# Patient Record
Sex: Male | Born: 1970 | Race: White | Hispanic: No | Marital: Married | Smoking: Never smoker
Health system: Southern US, Community
[De-identification: ages and names within clinical notes are randomized; demographics above are authoritative.]

## PROBLEM LIST (undated history)

## (undated) DIAGNOSIS — M719 Bursopathy, unspecified: Secondary | ICD-10-CM

## (undated) DIAGNOSIS — E785 Hyperlipidemia, unspecified: Secondary | ICD-10-CM

## (undated) DIAGNOSIS — N200 Calculus of kidney: Secondary | ICD-10-CM

## (undated) DIAGNOSIS — I10 Essential (primary) hypertension: Secondary | ICD-10-CM

## (undated) DIAGNOSIS — T7840XA Allergy, unspecified, initial encounter: Secondary | ICD-10-CM

## (undated) DIAGNOSIS — I2510 Atherosclerotic heart disease of native coronary artery without angina pectoris: Secondary | ICD-10-CM

## (undated) HISTORY — PX: KNEE ARTHROPLASTY: SHX992

## (undated) HISTORY — PX: NERVE SURGERY: SHX1016

## (undated) HISTORY — DX: Atherosclerotic heart disease of native coronary artery without angina pectoris: I25.10

## (undated) HISTORY — DX: Hyperlipidemia, unspecified: E78.5

## (undated) HISTORY — DX: Essential (primary) hypertension: I10

## (undated) HISTORY — DX: Allergy, unspecified, initial encounter: T78.40XA

---

## 2011-04-01 ENCOUNTER — Ambulatory Visit: Payer: Self-pay | Admitting: Specialist

## 2011-05-20 ENCOUNTER — Ambulatory Visit: Payer: Self-pay | Admitting: Specialist

## 2011-05-26 ENCOUNTER — Ambulatory Visit: Payer: Self-pay | Admitting: Specialist

## 2011-09-23 ENCOUNTER — Ambulatory Visit: Payer: Self-pay | Admitting: Family Medicine

## 2013-11-22 DIAGNOSIS — M545 Low back pain, unspecified: Secondary | ICD-10-CM | POA: Insufficient documentation

## 2013-11-22 DIAGNOSIS — M7060 Trochanteric bursitis, unspecified hip: Secondary | ICD-10-CM | POA: Insufficient documentation

## 2013-11-22 DIAGNOSIS — M7070 Other bursitis of hip, unspecified hip: Secondary | ICD-10-CM | POA: Insufficient documentation

## 2014-05-04 NOTE — Op Note (Signed)
PATIENT NAME:  Clarence PellantDICK, Clarence Mcconnell MR#:  [id number] DATE OF BIRTH:  [birth year]  DATE OF PROCEDURE:  05/26/2011  PREOPERATIVE DIAGNOSIS:  1. Complete tear of the left anterior cruciate ligament.  2. Tear of the posterior horn of the lateral meniscus.   POSTOPERATIVE DIAGNOSIS:  1. Complete tear of the left anterior cruciate ligament.  2. Tear of the posterior horn of the lateral meniscus.   OPERATION:  1. Arthroscopically-assisted left anterior cruciate ligament reconstruction using patellar tendon and bone.  2. Arthroscopic partial left lateral meniscectomy.   SURGEON: Valinda HoarHoward E. Kainoah Bartosiewicz, MD   ANESTHESIA: General LMA plus femoral nerve block.   COMPLICATIONS: None.   ESTIMATED BLOOD LOSS: 100 mL.  REPLACEMENT: None.   OPERATIVE FINDINGS: The patient had a complete tear of the left anterior cruciate ligament with a totally incompetent ligament with a few fibers remaining. The posterior cruciate ligament was intact. The medial compartment was normal with intact articular surfaces. The meniscus was stable.  I did not feel that it needed any peripheral stabilization. The lateral meniscus showed tearing in the posterior portion. The articular surfaces were intact. The patellofemoral region showed an intact patella. There was some chondral erosion in the trochlea just above the notch. There were no loose bodies. There was minimal synovitis.   OPERATIVE PROCEDURE: The patient was brought to the Operating Room where he underwent satisfactory general LMA anesthesia after a femoral nerve block had been put in place. The left leg was prepped and draped in a sterile fashion and positioned appropriately. The tourniquet was not used. The arthroscopy was carried out through standard portals, and the above findings were observed. The smooth motorized resector and basket forceps were used to trim the posterior horn of the lateral meniscus. This tear was chronic in nature. The anterior cruciate ligament  and few  remaining fibers were removed. He had a positive Lachman. A generous notchplasty was done as he had a very narrow notch. Once this was completed, the anterior incision was made just to the medial side of midline from the joint line down to the end of the patellar tendon. The dissection was carried out sharply through subcutaneous tissue with electrocautery being used for hemostasis. The peritenon was released medially from the tibial tubercle all the way up over the patella and retracted and protected. The 10 mm double-bladed knife was inserted and a 10 mm graft isolated from the midportion of the patellar tendon. This was carried up over the patella and down over Gerdy's tubercle at the patellar tendon insertion. Bone grafts, 10 x 25 mm, were taken from the patella and the tibial tubercle, and the entire graft was placed on the back table. It was debrided with a rongeur and a bur. Drill holes were made, and it was placed under tension. It measured 95 mm. It was covered with a moist antibiotic sponge. The tibial alignment jig was inserted at 55 degrees of angulation and a guidepin inserted. This was deemed in good position, and the 10 mm reamer was used to ream the tibia. The blunt trocar was inserted to smooth the tunnel edges. A rasp was also used. The 7 mm offset femoral guide was inserted and a Beath pin passed up through the femur and out the anterior thigh. The #2 Orthocord sutures were placed through both bone grafts, and the sutures were passed through the Beath pin and brought up through the femur and out the thigh. The graft was then advanced into the tibia and across the joint  into the femur without difficulty. The length was excellent. We had created a 25 mm tunnel in the femur as well with the coring reamer. The nitinol wire was inserted parallel to the graft in the femur, and a 9 x 23 mm Milagro screw was inserted and seated snugly. Traction on the graft showed it to be extremely tight. The knee was  then placed in 20 degrees of flexion and other nitinol wire passed into the tibial tunnel, and a 9 x 30 mm Milagro screw was inserted and tightened while significant tension was placed on the graft. Once this was in place, the wire was removed. Testing the AP laxity showed it was quite tight now. It was flexed from 0 to 110 degrees easily. The arthroscope showed the graft to be in excellent position. Final irrigation and debridement was carried out in the joint and electrocautery used on small bleeders. The peritenon was repaired with 3-0 Vicryl, and bone chips were placed in the patellar donor site. Bone wax was placed in the tibial donor site and over the tibial tunnel. The subcutaneous tissue was closed with 3-0 Vicryl, and the skin was closed with staples; 0.5% Marcaine with epinephrine and morphine was placed into the joint. A dry sterile dressing with Polar Care and knee immobilizer were applied. The patient was awakened and taken to recovery in good condition.   ____________________________ Valinda Hoar, MD hem:cbb D: 05/26/2011 11:13:45 ET T: 05/26/2011 11:52:31 ET JOB#: [id number] Valinda Hoar MD ELECTRONICALLY SIGNED 06/01/2011 12:45

## 2014-05-18 ENCOUNTER — Emergency Department: Payer: 59

## 2014-05-18 ENCOUNTER — Encounter: Payer: Self-pay | Admitting: Emergency Medicine

## 2014-05-18 ENCOUNTER — Emergency Department
Admission: EM | Admit: 2014-05-18 | Discharge: 2014-05-18 | Disposition: A | Discharge status: Home / Self Care | Payer: 59 | Attending: Emergency Medicine | Admitting: Emergency Medicine

## 2014-05-18 DIAGNOSIS — T148XXA Other injury of unspecified body region, initial encounter: Secondary | ICD-10-CM

## 2014-05-18 DIAGNOSIS — Z23 Encounter for immunization: Secondary | ICD-10-CM | POA: Diagnosis not present

## 2014-05-18 DIAGNOSIS — S52501A Unspecified fracture of the lower end of right radius, initial encounter for closed fracture: Secondary | ICD-10-CM | POA: Insufficient documentation

## 2014-05-18 DIAGNOSIS — Y9289 Other specified places as the place of occurrence of the external cause: Secondary | ICD-10-CM | POA: Insufficient documentation

## 2014-05-18 DIAGNOSIS — Y998 Other external cause status: Secondary | ICD-10-CM | POA: Diagnosis not present

## 2014-05-18 DIAGNOSIS — W1839XA Other fall on same level, initial encounter: Secondary | ICD-10-CM | POA: Insufficient documentation

## 2014-05-18 DIAGNOSIS — S50811A Abrasion of right forearm, initial encounter: Secondary | ICD-10-CM | POA: Insufficient documentation

## 2014-05-18 DIAGNOSIS — Y9389 Activity, other specified: Secondary | ICD-10-CM | POA: Insufficient documentation

## 2014-05-18 DIAGNOSIS — S59911A Unspecified injury of right forearm, initial encounter: Secondary | ICD-10-CM | POA: Diagnosis present

## 2014-05-18 DIAGNOSIS — W19XXXA Unspecified fall, initial encounter: Secondary | ICD-10-CM

## 2014-05-18 HISTORY — DX: Bursopathy, unspecified: M71.9

## 2014-05-18 MED ORDER — OXYCODONE-ACETAMINOPHEN 5-325 MG PO TABS: 1.0000 | ORAL_TABLET | ORAL | Status: DC | PRN

## 2014-05-18 MED ORDER — MORPHINE SULFATE 4 MG/ML IJ SOLN
4.0000 mg | Freq: Once | INTRAMUSCULAR | Status: AC
  Administered 2014-05-18: 4 mg via INTRAMUSCULAR

## 2014-05-18 MED ORDER — MORPHINE SULFATE 4 MG/ML IJ SOLN
INTRAMUSCULAR | Status: AC
  Administered 2014-05-18: 4 mg via INTRAMUSCULAR
  Filled 2014-05-18: qty 1

## 2014-05-18 MED ORDER — TETANUS-DIPHTH-ACELL PERTUSSIS 5-2.5-18.5 LF-MCG/0.5 IM SUSP
INTRAMUSCULAR | Status: AC
  Administered 2014-05-18: 0.5 mL via INTRAMUSCULAR
  Filled 2014-05-18: qty 0.5

## 2014-05-18 MED ORDER — TETANUS-DIPHTH-ACELL PERTUSSIS 5-2.5-18.5 LF-MCG/0.5 IM SUSP
0.5000 mL | Freq: Once | INTRAMUSCULAR | Status: AC
  Administered 2014-05-18: 0.5 mL via INTRAMUSCULAR

## 2014-05-18 NOTE — Discharge Instructions (Signed)
Please seek medical attention for any concerning swollen or discoloration of her fingers, decreased sensation of her fingers, severe pain, high fevers or any other new or concerning symptoms.   Radial Fracture You have a broken bone (fracture) of the forearm. This is the part of your arm between the elbow and your wrist. Your forearm is made up of two bones. These are the radius and ulna. Your fracture is in the radial shaft. This is the bone in your forearm located on the thumb side. A cast or splint is used to protect and keep your injured bone from moving. The cast or splint will be on generally for about 5 to 6 weeks, with individual variations. HOME CARE INSTRUCTIONS   Keep the injured part elevated while sitting or lying down. Keep the injury above the level of your heart (the center of the chest). This will decrease swelling and pain.  Apply ice to the injury for 15-20 minutes, 03-04 times per day while awake, for 2 days. Put the ice in a plastic bag and place a towel between the bag of ice and your cast or splint.  Move your fingers to avoid stiffness and minimize swelling.  If you have a plaster or fiberglass cast:  Do not try to scratch the skin under the cast using sharp or pointed objects.  Check the skin around the cast every day. You may put lotion on any red or sore areas.  Keep your cast dry and clean.  If you have a plaster splint:  Wear the splint as directed.  You may loosen the elastic around the splint if your fingers become numb, tingle, or turn cold or blue.  Do not put pressure on any part of your cast or splint. It may break. Rest your cast only on a pillow for the first 24 hours until it is fully hardened.  Your cast or splint can be protected during bathing with a plastic bag. Do not lower the cast or splint into water.  Only take over-the-counter or prescription medicines for pain, discomfort, or fever as directed by your caregiver. SEEK IMMEDIATE MEDICAL  CARE IF:   Your cast gets damaged or breaks.  You have more severe pain or swelling than you did before getting the cast.  You have severe pain when stretching your fingers.  There is a bad smell, new stains and/or pus-like (purulent) drainage coming from under the cast.  Your fingers or hand turn pale or blue and become cold or your loose feeling. Document Released: 06/09/2005 Document Revised: 03/21/2011 Document Reviewed: 09/05/2005 [facility] Patient Information 2015 [employer] [place]. This information is not intended to replace advice given to you by your health care provider. Make sure you discuss any questions you have with your health care provider.

## 2014-05-18 NOTE — ED Notes (Signed)
Patient states he fell off of boat approx 7 feet approx 1 hour ago. States he started to fall head first, but held arms and hands out to catch self to avoid hitting head. Complaining of pain to entire right side. Ambulatory to triage.

## 2014-05-18 NOTE — ED Provider Notes (Signed)
[facility] Emergency Department Provider Note    ____________________________________________  Time seen: 1740  I have reviewed the triage vital signs and the nursing notes.   HISTORY  Chief Complaint Fall and Shoulder Injury   History limited by: Not Limited   HPI Clarence Mcconnell is a 44 y.o. male who presents to the emergency department after a roughly 7 foot fall onto his right side. Patient was working on his boat when he fell off the bowel. He states the initial impact was on his right arm.He denies hitting his head. He denies loss of consciousness. States he has a little more acute on chronic right hip pain. He was able to ambulate after the fall. That happened roughly 1 hour before presenting to the emergency department. Denies any change to sensation of his arms or legs.    Past Medical History  Diagnosis Date  . Bursitis     Right    There are no active problems to display for this patient.   Past Surgical History  Procedure Laterality Date  . Knee arthroplasty      No current outpatient prescriptions on file.  Allergies Review of patient's allergies indicates no known allergies.  No family history on file.  Social History History  Substance Use Topics  . Smoking status: Never Smoker   . Smokeless tobacco: Not on file  . Alcohol Use: No    Review of Systems Constitutional: Negative for fever. Cardiovascular: Negative for chest pain. Respiratory: Negative for shortness of breath. Gastrointestinal: Negative for abdominal pain, vomiting and diarrhea. Genitourinary: Negative for dysuria. Musculoskeletal: Negative for back pain. Right Wrist pain right hip pain Skin: Negative for rash. Neurological: Negative for headaches, focal weakness or numbness.   10-point ROS otherwise negative.  ____________________________________________   PHYSICAL EXAM:  VITAL SIGNS: ED Triage Vitals  Enc Vitals Group     BP 05/18/14 1715  152/90 mmHg     Pulse Rate 05/18/14 1715 76     Resp --      Temp 05/18/14 1715 97.6 F (36.4 C)     Temp Source 05/18/14 1715 Oral     SpO2 05/18/14 1715 100 %     Weight 05/18/14 1715 175 lb (79.379 kg)     Height 05/18/14 1715 5\' 10"  (1.778 m)   Constitutional: Alert and oriented. Appears to be in mild pain, holding his right hand towards his chest. Eyes: Conjunctivae are normal. PERRL. Normal extraocular movements. ENT   Head: Normocephalic and atraumatic.      Ears: No hematympanum.    Nose: No congestion/rhinnorhea. No blood in nares.    Mouth/Throat: Mucous membranes are moist. No dental injury.   Neck: No stridor. Trachea midline. No midline cervical tenderness. Painless ROM.  Hematological/Lymphatic/Immunilogical: No cervical lymphadenopathy. Cardiovascular: Normal rate, regular rhythm.  No murmurs, rubs, or gallops. Pelvis stable.  Respiratory: Normal respiratory effort without tachypnea nor retractions. Breath sounds are clear and equal bilaterally. No wheezes/rales/rhonchi. No crepitus. No chest wall tenderness.  Gastrointestinal: Soft and nontender. No distention.  Genitourinary: Deferred Musculoskeletal: Normal range of motion in all extremities. Right wrist with mild deformity, tenderness. Neurovascularly intact distally. Neurologic:  Normal speech and language. No gross focal neurologic deficits are appreciated. Speech is normal.  Skin:  Skin is warm, dry. Abrasion to right proximal forearm Psychiatric: Mood and affect are normal. Speech and behavior are normal. Patient exhibits appropriate insight and judgment.  ____________________________________________    LABS (pertinent positives/negatives)  None  ____________________________________________   EKG  None  ____________________________________________    RADIOLOGY  IMPRESSION: Acute transverse fracture of the distal right radius with extension of the fracture line to the joint space.  Fracture fragments are slightly displaced, with dorsal displacement on the lateral view. Associated soft tissue swelling. ____________________________________________   PROCEDURES  Procedure(s) performed:  Splint applied by tech.  Good position.  Distally N/V intact, sensation intact. No discoloration.   Critical Care performed: No  ____________________________________________   INITIAL IMPRESSION / ASSESSMENT AND PLAN / ED COURSE  Pertinent labs & imaging results that were available during my care of the patient were reviewed by me and considered in my medical decision making (see chart for details).  Patient here after fall. Injuries include abrasion to right proximal forearm, will update tetanus. Additionally patient has swelling, deformity and tenderness to right wrist. We will get an x-ray.  ----------------------------------------- 7:10 PM on 05/18/2014 -----------------------------------------  X-ray does show an acute transverse fracture of the distal right radius. Talked to Dr. Joice LoftsPoggi with orthopedics who looked at the films. Given that the patient will require a surgical fix he did not think patient would have significant benefit from acutely trying to reduce the fracture. We will splint and have patient follow-up with orthopedics.  ____________________________________________   FINAL CLINICAL IMPRESSION(S) / ED DIAGNOSES  Final diagnoses:  Fall, initial encounter  Distal radius fracture, right, closed, initial encounter  Abrasion      Phineas SemenGraydon Myrella Fahs, MD 05/18/14 2004

## 2014-05-20 ENCOUNTER — Ambulatory Visit: Payer: 59 | Admitting: Anesthesiology

## 2014-05-20 ENCOUNTER — Encounter
Admission: RE | Disposition: A | Discharge status: Home / Self Care | Payer: Self-pay | Source: Ambulatory Visit | Attending: Orthopedic Surgery

## 2014-05-20 ENCOUNTER — Ambulatory Visit
Admission: RE | Admit: 2014-05-20 | Discharge: 2014-05-20 | Disposition: A | Discharge status: Home / Self Care | Payer: 59 | Source: Ambulatory Visit | Attending: Orthopedic Surgery | Admitting: Orthopedic Surgery

## 2014-05-20 ENCOUNTER — Encounter: Payer: Self-pay | Admitting: *Deleted

## 2014-05-20 DIAGNOSIS — N189 Chronic kidney disease, unspecified: Secondary | ICD-10-CM | POA: Insufficient documentation

## 2014-05-20 DIAGNOSIS — Z91010 Allergy to peanuts: Secondary | ICD-10-CM | POA: Diagnosis not present

## 2014-05-20 DIAGNOSIS — Z87892 Personal history of anaphylaxis: Secondary | ICD-10-CM | POA: Diagnosis not present

## 2014-05-20 DIAGNOSIS — Z8249 Family history of ischemic heart disease and other diseases of the circulatory system: Secondary | ICD-10-CM | POA: Diagnosis not present

## 2014-05-20 DIAGNOSIS — E785 Hyperlipidemia, unspecified: Secondary | ICD-10-CM | POA: Diagnosis not present

## 2014-05-20 DIAGNOSIS — I129 Hypertensive chronic kidney disease with stage 1 through stage 4 chronic kidney disease, or unspecified chronic kidney disease: Secondary | ICD-10-CM | POA: Diagnosis not present

## 2014-05-20 DIAGNOSIS — Z8261 Family history of arthritis: Secondary | ICD-10-CM | POA: Diagnosis not present

## 2014-05-20 DIAGNOSIS — Z841 Family history of disorders of kidney and ureter: Secondary | ICD-10-CM | POA: Insufficient documentation

## 2014-05-20 DIAGNOSIS — S52501A Unspecified fracture of the lower end of right radius, initial encounter for closed fracture: Secondary | ICD-10-CM | POA: Insufficient documentation

## 2014-05-20 DIAGNOSIS — Z9889 Other specified postprocedural states: Secondary | ICD-10-CM | POA: Insufficient documentation

## 2014-05-20 DIAGNOSIS — Z888 Allergy status to other drugs, medicaments and biological substances status: Secondary | ICD-10-CM | POA: Diagnosis not present

## 2014-05-20 DIAGNOSIS — W1789XA Other fall from one level to another, initial encounter: Secondary | ICD-10-CM | POA: Insufficient documentation

## 2014-05-20 DIAGNOSIS — G5601 Carpal tunnel syndrome, right upper limb: Secondary | ICD-10-CM | POA: Insufficient documentation

## 2014-05-20 DIAGNOSIS — Z87442 Personal history of urinary calculi: Secondary | ICD-10-CM | POA: Diagnosis not present

## 2014-05-20 DIAGNOSIS — Z79891 Long term (current) use of opiate analgesic: Secondary | ICD-10-CM | POA: Insufficient documentation

## 2014-05-20 HISTORY — PX: CARPAL TUNNEL RELEASE: SHX101

## 2014-05-20 HISTORY — DX: Calculus of kidney: N20.0

## 2014-05-20 HISTORY — PX: OPEN REDUCTION INTERNAL FIXATION (ORIF) SCAPHOID WITH DISTAL RADIUS GRAFT: SHX5667

## 2014-05-20 SURGERY — CARPAL TUNNEL RELEASE
Anesthesia: General | Laterality: Right

## 2014-05-20 MED ORDER — LABETALOL HCL 5 MG/ML IV SOLN
10.0000 mg | Freq: Once | INTRAVENOUS | Status: AC
  Administered 2014-05-20: 10 mg via INTRAVENOUS

## 2014-05-20 MED ORDER — LIDOCAINE HCL (PF) 1 % IJ SOLN
INTRAMUSCULAR | Status: AC
  Filled 2014-05-20: qty 30

## 2014-05-20 MED ORDER — ONDANSETRON HCL 4 MG/2ML IJ SOLN: 4.0000 mg | Freq: Once | INTRAMUSCULAR | Status: DC | PRN

## 2014-05-20 MED ORDER — MIDAZOLAM HCL 2 MG/2ML IJ SOLN
INTRAMUSCULAR | Status: DC | PRN
  Administered 2014-05-20: 2 mg via INTRAVENOUS

## 2014-05-20 MED ORDER — DIPHENHYDRAMINE HCL 50 MG/ML IJ SOLN
25.0000 mg | Freq: Once | INTRAMUSCULAR | Status: AC
  Administered 2014-05-20: 25 mg via INTRAVENOUS

## 2014-05-20 MED ORDER — HYDROMORPHONE HCL 1 MG/ML IJ SOLN
0.2500 mg | INTRAMUSCULAR | Status: DC | PRN
  Administered 2014-05-20 (×3): 0.5 mg via INTRAVENOUS

## 2014-05-20 MED ORDER — NEOMYCIN-POLYMYXIN B GU 40-200000 IR SOLN
Status: AC
  Filled 2014-05-20: qty 2

## 2014-05-20 MED ORDER — LIDOCAINE HCL (CARDIAC) 20 MG/ML IV SOLN
INTRAVENOUS | Status: DC | PRN
  Administered 2014-05-20: 100 mg via INTRAVENOUS

## 2014-05-20 MED ORDER — CEFAZOLIN SODIUM-DEXTROSE 2-3 GM-% IV SOLR
INTRAVENOUS | Status: AC
  Filled 2014-05-20: qty 50

## 2014-05-20 MED ORDER — ONDANSETRON HCL 4 MG/2ML IJ SOLN
INTRAMUSCULAR | Status: DC | PRN
  Administered 2014-05-20: 4 mg via INTRAVENOUS

## 2014-05-20 MED ORDER — DIPHENHYDRAMINE HCL 50 MG/ML IJ SOLN
INTRAMUSCULAR | Status: AC
  Filled 2014-05-20: qty 1

## 2014-05-20 MED ORDER — NEOMYCIN-POLYMYXIN B GU IR SOLN
Status: DC | PRN
Start: 2014-05-20 — End: 2014-05-20
  Administered 2014-05-20: 2 mL

## 2014-05-20 MED ORDER — HYDROMORPHONE HCL 1 MG/ML IJ SOLN
INTRAMUSCULAR | Status: AC
  Administered 2014-05-20: 0.5 mg via INTRAVENOUS
  Filled 2014-05-20: qty 1

## 2014-05-20 MED ORDER — HYDRALAZINE HCL 20 MG/ML IJ SOLN
INTRAMUSCULAR | Status: AC
  Administered 2014-05-20: 10 mg via INTRAVENOUS
  Filled 2014-05-20: qty 1

## 2014-05-20 MED ORDER — PROPOFOL 10 MG/ML IV BOLUS
INTRAVENOUS | Status: DC | PRN
  Administered 2014-05-20: 200 mg via INTRAVENOUS

## 2014-05-20 MED ORDER — BUPIVACAINE HCL 0.5 % IJ SOLN
INTRAMUSCULAR | Status: DC | PRN
  Administered 2014-05-20: 20 mL

## 2014-05-20 MED ORDER — LABETALOL HCL 5 MG/ML IV SOLN
INTRAVENOUS | Status: AC
  Filled 2014-05-20: qty 4

## 2014-05-20 MED ORDER — LACTATED RINGERS IV SOLN
INTRAVENOUS | Status: DC
  Administered 2014-05-20: 14:00:00 via INTRAVENOUS

## 2014-05-20 MED ORDER — FENTANYL CITRATE (PF) 100 MCG/2ML IJ SOLN
INTRAMUSCULAR | Status: DC | PRN
  Administered 2014-05-20: 50 ug via INTRAVENOUS
  Administered 2014-05-20: 100 ug via INTRAVENOUS
  Administered 2014-05-20: 50 ug via INTRAVENOUS

## 2014-05-20 MED ORDER — HYDRALAZINE HCL 20 MG/ML IJ SOLN
10.0000 mg | Freq: Once | INTRAMUSCULAR | Status: AC
  Administered 2014-05-20: 10 mg via INTRAVENOUS

## 2014-05-20 MED ORDER — CEFAZOLIN SODIUM-DEXTROSE 2-3 GM-% IV SOLR
2.0000 g | Freq: Once | INTRAVENOUS | Status: AC
  Administered 2014-05-20: 2 g via INTRAVENOUS

## 2014-05-20 MED ORDER — BUPIVACAINE HCL (PF) 0.5 % IJ SOLN
INTRAMUSCULAR | Status: AC
  Filled 2014-05-20: qty 30

## 2014-05-20 MED ORDER — DEXAMETHASONE SODIUM PHOSPHATE 4 MG/ML IJ SOLN
INTRAMUSCULAR | Status: DC | PRN
  Administered 2014-05-20: 5 mg via INTRAVENOUS

## 2014-05-20 SURGICAL SUPPLY — 50 items
2.0MM SILVER DRILL BIT ×2 IMPLANT
2.5MM GOLD DRILL BITS ×2 IMPLANT
BANDAGE ELASTIC 3 CLIP ST LF (GAUZE/BANDAGES/DRESSINGS) ×2 IMPLANT
BANDAGE ELASTIC 4 CLIP ST LF (GAUZE/BANDAGES/DRESSINGS) ×2 IMPLANT
BIT DRILL 2 FAST STEP (BIT) ×2 IMPLANT
BIT DRILL 2.5X4 QC (BIT) ×2 IMPLANT
BNDG ESMARK 4X12 TAN STRL LF (GAUZE/BANDAGES/DRESSINGS) ×2 IMPLANT
CANISTER SUCT 1200ML W/VALVE (MISCELLANEOUS) ×2 IMPLANT
CHLORAPREP W/TINT 26ML (MISCELLANEOUS) ×2 IMPLANT
DRAPE FLUOR MINI C-ARM 54X84 (DRAPES) ×2 IMPLANT
DRSG TELFA 3X8 NADH (GAUZE/BANDAGES/DRESSINGS) ×2 IMPLANT
ELECT CAUTERY NEEDLE 2.0 MIC (NEEDLE) ×2 IMPLANT
ELECT CAUTERY NEEDLE TIP 1.0 (MISCELLANEOUS) ×2
ELECTRODE CAUTERY NEDL TIP 1.0 (MISCELLANEOUS) ×1 IMPLANT
GAUZE PETRO XEROFOAM 1X8 (MISCELLANEOUS) ×2 IMPLANT
GAUZE SPONGE 4X4 12PLY STRL (GAUZE/BANDAGES/DRESSINGS) ×2 IMPLANT
GLOVE BIOGEL PI IND STRL 9 (GLOVE) ×1 IMPLANT
GLOVE BIOGEL PI INDICATOR 9 (GLOVE) ×1
GLOVE SURG ORTHO 9.0 STRL STRW (GLOVE) ×2 IMPLANT
GOWN SPECIALTY ULTRA XL (MISCELLANEOUS) ×2 IMPLANT
GOWN STRL REUS W/ TWL LRG LVL3 (GOWN DISPOSABLE) ×1 IMPLANT
GOWN STRL REUS W/TWL 2XL LVL3 (GOWN DISPOSABLE) ×2 IMPLANT
GOWN STRL REUS W/TWL LRG LVL3 (GOWN DISPOSABLE) ×1
K-WIRE 1.6 (WIRE) ×1
K-WIRE FX5X1.6XNS BN SS (WIRE) ×1
KIT RM TURNOVER STRD PROC AR (KITS) ×2 IMPLANT
KWIRE FX5X1.6XNS BN SS (WIRE) ×1 IMPLANT
NEEDLE FILTER BLUNT 18X 1/2SAF (NEEDLE) ×1
NEEDLE FILTER BLUNT 18X1 1/2 (NEEDLE) ×1 IMPLANT
NS IRRIG 500ML POUR BTL (IV SOLUTION) ×2 IMPLANT
PACK EXTREMITY ARMC (MISCELLANEOUS) ×2 IMPLANT
PAD CAST CTTN 4X4 STRL (SOFTGOODS) ×1 IMPLANT
PAD GROUND ADULT SPLIT (MISCELLANEOUS) ×2 IMPLANT
PADDING CAST COTTON 4X4 STRL (SOFTGOODS) ×1
PEG SUBCHONDRAL SMOOTH 2.0X16 (Peg) ×2 IMPLANT
PEG SUBCHONDRAL SMOOTH 2.0X22 (Peg) ×4 IMPLANT
PEG SUBCHONDRAL SMOOTH 2.0X24 (Peg) ×2 IMPLANT
PEG SUBCHONDRAL SMOOTH 2.0X26 (Peg) ×6 IMPLANT
PLATE SHORT 24.4X51.3 RT (Plate) ×2 IMPLANT
SCREW BN 12X3.5XNS CORT TI (Screw) ×2 IMPLANT
SCREW CORT 3.5X12 (Screw) ×2 IMPLANT
SCREW CORT 3.5X16 LNG (Screw) ×2 IMPLANT
SPLINT CAST 1 STEP 3X12 (MISCELLANEOUS) ×2 IMPLANT
STOCKINETTE STRL 4IN 9604848 (GAUZE/BANDAGES/DRESSINGS) ×2 IMPLANT
SUT ETHILON 4-0 (SUTURE) ×1
SUT ETHILON 4-0 FS2 18XMFL BLK (SUTURE) ×1
SUT VIC AB 4-0 FS2 27 (SUTURE) ×2 IMPLANT
SUT VICRYL 3-0 27IN (SUTURE) ×2 IMPLANT
SUTURE ETHLN 4-0 FS2 18XMF BLK (SUTURE) ×1 IMPLANT
SYR 3ML LL SCALE MARK (SYRINGE) ×2 IMPLANT

## 2014-05-20 NOTE — Anesthesia Postprocedure Evaluation (Signed)
  Anesthesia Post-op Note  Patient: Clarence Mcconnell  Procedure(s) Performed: Procedure(s): CARPAL TUNNEL RELEASE (Right) OPEN REDUCTION INTERNAL FIXATION (ORIF) SCAPHOID WITH DISTAL RADIUS GRAFT (Right)  Anesthesia type:General  Patient location: PACU  Post pain: Pain level controlled  Post assessment: Post-op Vital signs reviewed, Patient's Cardiovascular Status Stable, Respiratory Function Stable, Patent Airway and No signs of Nausea or vomiting  Post vital signs: Reviewed and stable  Last Vitals:  Filed Vitals:   05/20/14 1720  BP:   Pulse:   Temp: 36.4 C  Resp: 12    Level of consciousness: awake, alert  and patient cooperative  Complications: No apparent anesthesia complications

## 2014-05-20 NOTE — Op Note (Signed)
05/20/2014  5:22 PM  PATIENT:  Clarence Mcconnell  44 y.o. male  PRE-OPERATIVE DIAGNOSIS:  DISTAL RADIUS FRACTURE right wrist and carpal tunnel syndrome  POST-OPERATIVE DIAGNOSIS:  Same  PROCEDURE: Open reduction internal fixation right distal radius and carpal tunnel release  SURGEON: Leitha SchullerMichael J Velma Hanna, MD  ASSISTANTS: None  ANESTHESIA:   general  EBL:    minimal  BLOOD ADMINISTERED:none  DRAINS: none   LOCAL MEDICATIONS USED:  MARCAINE     SPECIMEN:  No Specimen  DISPOSITION OF SPECIMEN:  N/A  COUNTS:  YES  TOURNIQUET:   Total Tourniquet Time Documented: Upper Arm (Right) - 78 minutes Total: Upper Arm (Right) - 78 minutes   IMPLANTS: [employer] hand innovations right DVR plate screws and smooth pegs  DICTATION: .Dragon Dictation patient brought to the operating room and after adequate anesthesia was obtained the right arm was prepped and draped in sterile fashion after patient identification timeout procedure were completed, fingertrap traction was applied through the index and middle fingers and 10 pounds of traction off the end of the table applied. Tourniquet was raised to 250 mmHg and a volar approach is made to the distal radius centered over the FCR tendon. FCR tendon was retracted radially and the deep fascia incised. The pronator was taken off the distal radius from the radial side and the fracture site exposed with traction and volar flexion anatomic alignment was obtained. The short standard width DVR plate was then applied to the distal radius with a distal first technique K wire holding the plate in position. The distal holes were filled using standard technique drilling and placing smooth pegs. The plate was then brought down to the shaft with 3 cortical screws. Anatomic alignment was obtained and the joint surface appeared well aligned. The fracture was intra-articular with 3 or more fragments present. Traction was released and under fluoroscopic evaluation distal fracture  appeared stable to with passive range of motion of the wrist.   The carpal tunnel releases and carried out with a 2 cm incision made in the palm in line with the ring metacarpal. Deep tissue was spread and the transverse carpal ligament identified and incised with a vascular hemostat protecting the underlying structures. There did appear to be swelling within the canal and complete release of the transverse carpal ligament was carried out. The wound was then closed with 2-0 Vicryl for deep subcutaneous,s and 4-0 Vicryl subcutaneously. 4-0 Monocryl was used to approximate skin edges and Dermabond was used for final skin closure.  The wound was covered with Telfa followed by a volar splint with cast padding Ace wrap was then applied and tourniquet let down Xeroform was placed over a few areas of abrasions away from the incision. Total tourniquet time was 78 minutes at 250 mmHg.  20 cc of half percent Sensorcaine was infiltrated near the incisions for postop analgesia.  PLAN OF CARE: Discharge to home after PACU  PATIENT DISPOSITION:  PACU - hemodynamically stable.

## 2014-05-20 NOTE — Brief Op Note (Signed)
05/20/2014  5:20 PM  PATIENT:  Clarence Mcconnell  44 y.o. male  PRE-OPERATIVE DIAGNOSIS:  DISTAL RADIUS FRACTURE right wrist  POST-OPERATIVE DIAGNOSIS:  * No post-op diagnosis entered *  PROCEDURE:  Procedure(s): CARPAL TUNNEL RELEASE (Right) OPEN REDUCTION INTERNAL FIXATION (ORIF) SCAPHOID WITH DISTAL RADIUS GRAFT (Right)  SURGEON:  Surgeon(s) and Role:    * Kennedy BuckerMichael Maci Eickholt, MD - Primary  PHYSICIAN ASSISTANT:   ASSISTANTS: none   ANESTHESIA:   general  EBL:     BLOOD ADMINISTERED:none  DRAINS: none   LOCAL MEDICATIONS USED:  MARCAINE     SPECIMEN:  No Specimen  DISPOSITION OF SPECIMEN:  N/A  COUNTS:  YES  TOURNIQUET:   Total Tourniquet Time Documented: Upper Arm (Right) - 78 minutes Total: Upper Arm (Right) - 78 minutes   DICTATION: .Reubin Milanragon Dictation  PLAN OF CARE: Discharge to home after PACU  PATIENT DISPOSITION:  PACU - hemodynamically stable.   Delay start of Pharmacological VTE agent (>24hrs) due to surgical blood loss or risk of bleeding: Not applicable

## 2014-05-20 NOTE — Anesthesia Preprocedure Evaluation (Signed)
Anesthesia Evaluation  Patient identified by MRN, date of birth, ID band Patient awake    Reviewed: Allergy & Precautions, NPO status , Patient's Chart, lab work & pertinent test results  History of Anesthesia Complications Negative for: history of anesthetic complications  Airway Mallampati: II  TM Distance: >3 FB Neck ROM: Full    Dental no notable dental hx.    Pulmonary neg pulmonary ROS,  breath sounds clear to auscultation  Pulmonary exam normal       Cardiovascular Exercise Tolerance: Good negative cardio ROS Normal cardiovascular examRhythm:Regular Rate:Normal     Neuro/Psych negative neurological ROS  negative psych ROS   GI/Hepatic negative GI ROS, Neg liver ROS,   Endo/Other  negative endocrine ROS  Renal/GU H/o kidney stones  negative genitourinary   Musculoskeletal negative musculoskeletal ROS (+)   Abdominal   Peds negative pediatric ROS (+)  Hematology negative hematology ROS (+)   Anesthesia Other Findings   Reproductive/Obstetrics negative OB ROS                             Anesthesia Physical Anesthesia Plan  ASA: II  Anesthesia Plan: General   Post-op Pain Management:    Induction: Intravenous  Airway Management Planned: LMA  Additional Equipment:   Intra-op Plan:   Post-operative Plan: Extubation in OR  Informed Consent: I have reviewed the patients History and Physical, chart, labs and discussed the procedure including the risks, benefits and alternatives for the proposed anesthesia with the patient or authorized representative who has indicated his/her understanding and acceptance.     Plan Discussed with: CRNA and Surgeon  Anesthesia Plan Comments:         Anesthesia Quick Evaluation

## 2014-05-20 NOTE — Transfer of Care (Signed)
Immediate Anesthesia Transfer of Care Note  Patient: Clarence Mcconnell  Procedure(s) Performed: Procedure(s): CARPAL TUNNEL RELEASE (Right) OPEN REDUCTION INTERNAL FIXATION (ORIF) SCAPHOID WITH DISTAL RADIUS GRAFT (Right)  Patient Location: PACU  Anesthesia Type:General  Level of Consciousness: awake and sedated  Airway & Oxygen Therapy: Patient Spontanous Breathing and Patient connected to face mask oxygen  Post-op Assessment: Report given to RN  Post vital signs: Reviewed and stable  Last Vitals:  Filed Vitals:   05/20/14 1402  BP: 149/92  Pulse: 65  Temp: 36.6 C  Resp: 16    Complications: No apparent anesthesia complications

## 2014-05-20 NOTE — Discharge Instructions (Addendum)
Cast or Splint Care °Casts and splints support injured limbs and keep bones from moving while they heal. It is important to care for your cast or splint at home.   °HOME CARE INSTRUCTIONS °· Keep the cast or splint uncovered during the drying period. It can take 24 to 48 hours to dry if it is made of plaster. A fiberglass cast will dry in less than 1 hour. °· Do not rest the cast on anything harder than a pillow for the first 24 hours. °· Do not put weight on your injured limb or apply pressure to the cast until your health care provider gives you permission. °· Keep the cast or splint dry. Wet casts or splints can lose their shape and may not support the limb as well. A wet cast that has lost its shape can also create harmful pressure on your skin when it dries. Also, wet skin can become infected. °· Cover the cast or splint with a plastic bag when bathing or when out in the rain or snow. If the cast is on the trunk of the body, take sponge baths until the cast is removed. °· If your cast does become wet, dry it with a towel or a blow dryer on the cool setting only. °· Keep your cast or splint clean. Soiled casts may be wiped with a moistened cloth. °· Do not place any hard or soft foreign objects under your cast or splint, such as cotton, toilet paper, lotion, or powder. °· Do not try to scratch the skin under the cast with any object. The object could get stuck inside the cast. Also, scratching could lead to an infection. If itching is a problem, use a blow dryer on a cool setting to relieve discomfort. °· Do not trim or cut your cast or remove padding from inside of it. °· Exercise all joints next to the injury that are not immobilized by the cast or splint. For example, if you have a long leg cast, exercise the hip joint and toes. If you have an arm cast or splint, exercise the shoulder, elbow, thumb, and fingers. °· Elevate your injured arm or leg on 1 or 2 pillows for the first 1 to 3 days to decrease  swelling and pain. It is best if you can comfortably elevate your cast so it is higher than your heart. °SEEK MEDICAL CARE IF:  °· Your cast or splint cracks. °· Your cast or splint is too tight or too loose. °· You have unbearable itching inside the cast. °· Your cast becomes wet or develops a soft spot or area. °· You have a bad smell coming from inside your cast. °· You get an object stuck under your cast. °· Your skin around the cast becomes red or raw. °· You have new pain or worsening pain after the cast has been applied. °SEEK IMMEDIATE MEDICAL CARE IF:  °· You have fluid leaking through the cast. °· You are unable to move your fingers or toes. °· You have discolored (blue or white), cool, painful, or very swollen fingers or toes beyond the cast. °· You have tingling or numbness around the injured area. °· You have severe pain or pressure under the cast. °· You have any difficulty with your breathing or have shortness of breath. °· You have chest pain. °Document Released: 12/25/1999 Document Revised: 10/17/2012 Document Reviewed: 07/05/2012 °ExitCare® Patient Information ©2015 [employer]. This information is not intended to replace advice given to you by your health care   provider. Make sure you discuss any questions you have with your health care provider.  Keep arm elevated. Apply ice to the back of the wrist and hand today and tomorrow. Try to move the fingers is much as tolerated. General Anesthesia, Care After Refer to this sheet in the next few weeks. These instructions provide you with information on caring for yourself after your procedure. Your health care provider may also give you more specific instructions. Your treatment has been planned according to current medical practices, but problems sometimes occur. Call your health care provider if you have any problems or questions after your procedure. WHAT TO EXPECT AFTER THE PROCEDURE After the procedure, it is typical to  experience:  Sleepiness.  Nausea and vomiting. HOME CARE INSTRUCTIONS  For the first 24 hours after general anesthesia:  Have a responsible person with you.  Do not drive a car. If you are alone, do not take public transportation.  Do not drink alcohol.  Do not take medicine that has not been prescribed by your health care provider.  Do not sign important papers or make important decisions.  You may resume a normal diet and activities as directed by your health care provider.  Change bandages (dressings) as directed.  If you have questions or problems that seem related to general anesthesia, call the hospital and ask for the anesthetist or anesthesiologist on call. SEEK MEDICAL CARE IF:  You have nausea and vomiting that continue the day after anesthesia.  You develop a rash. SEEK IMMEDIATE MEDICAL CARE IF:   You have difficulty breathing.  You have chest pain.  You have any allergic problems. Document Released: 04/04/2000 Document Revised: 01/01/2013 Document Reviewed: 07/12/2012 [facility] Patient Information 2015 [place], [place]. This information is not intended to replace advice given to you by your health care provider. Make sure you discuss any questions you have with your health care provider.

## 2014-05-20 NOTE — H&P (Signed)
Reviewed paper H+P, will be scanned into chart. No changes noted.  

## 2014-05-20 NOTE — Anesthesia Procedure Notes (Signed)
Procedure Name: LMA Insertion Date/Time: 05/20/2014 3:28 PM Performed by: Irving BurtonBACHICH, Kiwan Gadsden Pre-anesthesia Checklist: Patient identified, Emergency Drugs available, Suction available and Patient being monitored Patient Re-evaluated:Patient Re-evaluated prior to inductionOxygen Delivery Method: Circle system utilized Preoxygenation: Pre-oxygenation with 100% oxygen Intubation Type: IV induction Ventilation: Mask ventilation without difficulty LMA: LMA inserted LMA Size: 4.5 Number of attempts: 1 Tube secured with: Tape

## 2014-05-21 MED FILL — Neomycin-Polymyxin B GU Irrigation Soln: Qty: 2 | Status: AC

## 2014-05-22 ENCOUNTER — Encounter: Payer: Self-pay | Admitting: Orthopedic Surgery

## 2014-07-03 DIAGNOSIS — J309 Allergic rhinitis, unspecified: Secondary | ICD-10-CM

## 2014-07-03 DIAGNOSIS — I25119 Atherosclerotic heart disease of native coronary artery with unspecified angina pectoris: Secondary | ICD-10-CM

## 2014-07-03 DIAGNOSIS — I10 Essential (primary) hypertension: Secondary | ICD-10-CM

## 2014-07-03 DIAGNOSIS — E785 Hyperlipidemia, unspecified: Secondary | ICD-10-CM | POA: Insufficient documentation

## 2014-07-04 ENCOUNTER — Ambulatory Visit (INDEPENDENT_AMBULATORY_CARE_PROVIDER_SITE_OTHER): Payer: 59 | Admitting: Unknown Physician Specialty

## 2014-07-04 ENCOUNTER — Encounter: Payer: Self-pay | Admitting: Unknown Physician Specialty

## 2014-07-04 DIAGNOSIS — I10 Essential (primary) hypertension: Secondary | ICD-10-CM

## 2014-07-04 MED ORDER — LISINOPRIL 10 MG PO TABS: 10.0000 mg | ORAL_TABLET | Freq: Every day | ORAL | Status: DC

## 2014-07-04 MED ORDER — FLUTICASONE PROPIONATE 50 MCG/ACT NA SUSP: 2.0000 | Freq: Every day | NASAL | Status: AC

## 2014-07-04 NOTE — Patient Instructions (Signed)
DASH Eating Plan °DASH stands for "Dietary Approaches to Stop Hypertension." The DASH eating plan is a healthy eating plan that has been shown to reduce high blood pressure (hypertension). Additional health benefits may include reducing the risk of type 2 diabetes mellitus, heart disease, and stroke. The DASH eating plan may also help with weight loss. °WHAT DO I NEED TO KNOW ABOUT THE DASH EATING PLAN? °For the DASH eating plan, you will follow these general guidelines: °· Choose foods with a percent daily value for sodium of less than 5% (as listed on the food label). °· Use salt-free seasonings or herbs instead of table salt or sea salt. °· Check with your health care provider or pharmacist before using salt substitutes. °· Eat lower-sodium products, often labeled as "lower sodium" or "no salt added." °· Eat fresh foods. °· Eat more vegetables, fruits, and low-fat dairy products. °· Choose whole grains. Look for the word "whole" as the first word in the ingredient list. °· Choose fish and skinless chicken or turkey more often than red meat. Limit fish, poultry, and meat to 6 oz (170 g) each day. °· Limit sweets, desserts, sugars, and sugary drinks. °· Choose heart-healthy fats. °· Limit cheese to 1 oz (28 g) per day. °· Eat more home-cooked food and less restaurant, buffet, and fast food. °· Limit fried foods. °· Cook foods using methods other than frying. °· Limit canned vegetables. If you do use them, rinse them well to decrease the sodium. °· When eating at a restaurant, ask that your food be prepared with less salt, or no salt if possible. °WHAT FOODS CAN I EAT? °Seek help from a dietitian for individual calorie needs. °Grains °Whole grain or whole wheat bread. Brown rice. Whole grain or whole wheat pasta. Quinoa, bulgur, and whole grain cereals. Low-sodium cereals. Corn or whole wheat flour tortillas. Whole grain cornbread. Whole grain crackers. Low-sodium crackers. °Vegetables °Fresh or frozen vegetables  (raw, steamed, roasted, or grilled). Low-sodium or reduced-sodium tomato and vegetable juices. Low-sodium or reduced-sodium tomato sauce and paste. Low-sodium or reduced-sodium canned vegetables.  °Fruits °All fresh, canned (in natural juice), or frozen fruits. °Meat and Other Protein Products °Ground beef (85% or leaner), grass-fed beef, or beef trimmed of fat. Skinless chicken or turkey. Ground chicken or turkey. Pork trimmed of fat. All fish and seafood. Eggs. Dried beans, peas, or lentils. Unsalted nuts and seeds. Unsalted canned beans. °Dairy °Low-fat dairy products, such as skim or 1% milk, 2% or reduced-fat cheeses, low-fat ricotta or cottage cheese, or plain low-fat yogurt. Low-sodium or reduced-sodium cheeses. °Fats and Oils °Tub margarines without trans fats. Light or reduced-fat mayonnaise and salad dressings (reduced sodium). Avocado. Safflower, olive, or canola oils. Natural peanut or almond butter. °Other °Unsalted popcorn and pretzels. °The items listed above may not be a complete list of recommended foods or beverages. Contact your dietitian for more options. °WHAT FOODS ARE NOT RECOMMENDED? °Grains °White bread. White pasta. White rice. Refined cornbread. Bagels and croissants. Crackers that contain trans fat. °Vegetables °Creamed or fried vegetables. Vegetables in a cheese sauce. Regular canned vegetables. Regular canned tomato sauce and paste. Regular tomato and vegetable juices. °Fruits °Dried fruits. Canned fruit in light or heavy syrup. Fruit juice. °Meat and Other Protein Products °Fatty cuts of meat. Ribs, chicken wings, bacon, sausage, bologna, salami, chitterlings, fatback, hot dogs, bratwurst, and packaged luncheon meats. Salted nuts and seeds. Canned beans with salt. °Dairy °Whole or 2% milk, cream, half-and-half, and cream cheese. Whole-fat or sweetened yogurt. Full-fat   cheeses or blue cheese. Nondairy creamers and whipped toppings. Processed cheese, cheese spreads, or cheese  curds. °Condiments °Onion and garlic salt, seasoned salt, table salt, and sea salt. Canned and packaged gravies. Worcestershire sauce. Tartar sauce. Barbecue sauce. Teriyaki sauce. Soy sauce, including reduced sodium. Steak sauce. Fish sauce. Oyster sauce. Cocktail sauce. Horseradish. Ketchup and mustard. Meat flavorings and tenderizers. Bouillon cubes. Hot sauce. Tabasco sauce. Marinades. Taco seasonings. Relishes. °Fats and Oils °Butter, stick margarine, lard, shortening, ghee, and bacon fat. Coconut, palm kernel, or palm oils. Regular salad dressings. °Other °Pickles and olives. Salted popcorn and pretzels. °The items listed above may not be a complete list of foods and beverages to avoid. Contact your dietitian for more information. °WHERE CAN I FIND MORE INFORMATION? °National Heart, Lung, and [employer] Institute: www.nhlbi.nih.gov/health/health-topics/topics/dash/ °Document Released: 12/16/2010 Document Revised: 05/13/2013 Document Reviewed: 10/31/2012 °ExitCare® Patient Information ©2015 [employer]. This information is not intended to replace advice given to you by your health care provider. Make sure you discuss any questions you have with your health care provider. ° °

## 2014-07-04 NOTE — Assessment & Plan Note (Signed)
Pt with continued borderline high DBP on current treatment.  Increase Lisinopril 10 mg.

## 2014-07-04 NOTE — Progress Notes (Signed)
   BP 119/78 mmHg  Pulse 68  Temp(Src) 98.4 F (36.9 C) (Oral)  Ht 5' 9.5" (1.765 m)  Wt 178 lb 6.4 oz (80.922 kg)  BMI 25.98 kg/m2  SpO2 98%   Subjective:    Patient ID: Clarence Mcconnell, male    DOB: Jan 28, 1970, 43 y.o.   MRN: [id number]  HPI: Clarence Mcconnell is a 44 y.o. male  Chief Complaint  Patient presents with  . Follow-up    Hypertension    Relevant past medical, surgical, family and social history reviewed and updated as indicated. Interim medical history since our last visit reviewed. Allergies and medications reviewed and updated.  Hypertension This is a chronic problem. Condition status: having a DBP in the 90's at home. Pertinent negatives include no anxiety, blurred vision, chest pain, headaches, malaise/fatigue, neck pain, orthopnea, palpitations, peripheral edema, PND, shortness of breath or sweats. There are no associated agents to hypertension. The current treatment provides significant improvement. There are no compliance problems.      Review of Systems  Constitutional: Negative for malaise/fatigue.  Eyes: Negative for blurred vision.  Respiratory: Negative for shortness of breath.   Cardiovascular: Negative for chest pain, palpitations, orthopnea and PND.  Musculoskeletal: Negative for neck pain.  Neurological: Negative for headaches.    Per HPI unless specifically indicated above     Objective:    BP 119/78 mmHg  Pulse 68  Temp(Src) 98.4 F (36.9 C) (Oral)  Ht 5' 9.5" (1.765 m)  Wt 178 lb 6.4 oz (80.922 kg)  BMI 25.98 kg/m2  SpO2 98%  Wt Readings from Last 3 Encounters:  07/04/14 178 lb 6.4 oz (80.922 kg)  05/30/14 180 lb (81.647 kg)  05/20/14 182 lb (82.555 kg)    Physical Exam  Constitutional: He is oriented to person, place, and time. He appears well-developed and well-nourished. No distress.  HENT:  Head: Normocephalic and atraumatic.  Eyes: Conjunctivae and lids are normal. Right eye exhibits no discharge. Left eye exhibits no discharge.  No scleral icterus.  Cardiovascular: Normal rate and regular rhythm.   Pulmonary/Chest: Effort normal and breath sounds normal. No respiratory distress.  Abdominal: Soft. Normal appearance and bowel sounds are normal. He exhibits no distension. There is no splenomegaly or hepatomegaly. There is no tenderness.  Musculoskeletal: Normal range of motion.  Neurological: He is alert and oriented to person, place, and time.  Skin: Skin is intact. No rash noted. No pallor.  Psychiatric: He has a normal mood and affect. His behavior is normal. Judgment and thought content normal.    No results found for this or any previous visit.    Assessment & Plan:   Problem List Items Addressed This Visit      Cardiovascular and Mediastinum   Hypertension    Pt with continued borderline high DBP on current treatment.  Increase Lisinopril 10 mg.       Relevant Medications   lisinopril (PRINIVIL,ZESTRIL) 10 MG tablet       Follow up plan: Return in about 5 weeks (around 08/08/2014).

## 2014-08-08 ENCOUNTER — Ambulatory Visit (INDEPENDENT_AMBULATORY_CARE_PROVIDER_SITE_OTHER): Payer: 59 | Admitting: Unknown Physician Specialty

## 2014-08-08 ENCOUNTER — Encounter: Payer: Self-pay | Admitting: Unknown Physician Specialty

## 2014-08-08 VITALS — BP 113/78 | HR 64 | Temp 97.6°F | Ht 70.6 in | Wt 178.6 lb

## 2014-08-08 DIAGNOSIS — J309 Allergic rhinitis, unspecified: Secondary | ICD-10-CM | POA: Diagnosis not present

## 2014-08-08 DIAGNOSIS — I10 Essential (primary) hypertension: Secondary | ICD-10-CM | POA: Diagnosis not present

## 2014-08-08 MED ORDER — AMLODIPINE BESYLATE 5 MG PO TABS: 5.0000 mg | ORAL_TABLET | Freq: Every day | ORAL | Status: DC

## 2014-08-08 NOTE — Progress Notes (Signed)
   BP 113/78 mmHg  Pulse 64  Temp(Src) 97.6 F (36.4 C)  Ht 5' 10.6" (1.793 m)  Wt 178 lb 9.6 oz (81.012 kg)  BMI 25.20 kg/m2  SpO2 99%   Subjective:    Patient ID: Clarence Mcconnell, male    DOB: 11-14-1970, 44 y.o.   MRN: [id number]  HPI: Clarence Mcconnell is a 44 y.o. male  Chief Complaint  Patient presents with  . Hypertension   Hypertension  Last time, we increased his Lisinopril to 10 mg.  Taking BP med at night.  However he has a cough and feels he has a lot of phlegm.  This started after increasing the rate.  He feels he has sinus drainage as opposed to a dry cough.  Wakes up in the middle of the night.  States DBP is in the 90's at home and typically takes it at night.    Relevant past medical, surgical, family and social history reviewed and updated as indicated. Interim medical history since our last visit reviewed. Allergies and medications reviewed and updated.  Review of Systems  Per HPI unless specifically indicated above     Objective:    BP 113/78 mmHg  Pulse 64  Temp(Src) 97.6 F (36.4 C)  Ht 5' 10.6" (1.793 m)  Wt 178 lb 9.6 oz (81.012 kg)  BMI 25.20 kg/m2  SpO2 99%  Wt Readings from Last 3 Encounters:  08/08/14 178 lb 9.6 oz (81.012 kg)  07/04/14 178 lb 6.4 oz (80.922 kg)  05/30/14 180 lb (81.647 kg)    Physical Exam  Constitutional: He is oriented to person, place, and time. He appears well-developed and well-nourished. No distress.  HENT:  Head: Normocephalic and atraumatic.  Nose: Nose normal.  Mouth/Throat: Posterior oropharyngeal edema present.  Eyes: Conjunctivae and lids are normal. Right eye exhibits no discharge. Left eye exhibits no discharge. No scleral icterus.  Cardiovascular: Normal rate and regular rhythm.   Pulmonary/Chest: Effort normal and breath sounds normal. No respiratory distress.  Abdominal: Normal appearance and bowel sounds are normal. He exhibits no distension. There is no splenomegaly or hepatomegaly. There is no tenderness.   Musculoskeletal: Normal range of motion.  Neurological: He is alert and oriented to person, place, and time.  Skin: Skin is intact. No rash noted. No pallor.  Psychiatric: He has a normal mood and affect. His behavior is normal. Judgment and thought content normal.    No results found for this or any previous visit.    Assessment & Plan:   Problem List Items Addressed This Visit      Unprioritized   Allergic rhinitis - Primary    Continue with Flonase.  Add OTC such Clariton.        Hypertension    Better today but high at home.  Switch to Amlodipine.        Relevant Medications   amLODipine (NORVASC) 5 MG tablet       Follow up plan: Return in about 4 weeks (around 09/05/2014).

## 2014-08-08 NOTE — Assessment & Plan Note (Signed)
Continue with Flonase.  Add OTC such Clariton.

## 2014-08-08 NOTE — Assessment & Plan Note (Signed)
Better today but high at home.  Switch to Amlodipine.

## 2014-09-05 ENCOUNTER — Ambulatory Visit (INDEPENDENT_AMBULATORY_CARE_PROVIDER_SITE_OTHER): Payer: 59 | Admitting: Unknown Physician Specialty

## 2014-09-05 ENCOUNTER — Encounter: Payer: Self-pay | Admitting: Unknown Physician Specialty

## 2014-09-05 VITALS — BP 120/81 | HR 76 | Temp 98.1°F | Ht 70.5 in | Wt 179.0 lb

## 2014-09-05 DIAGNOSIS — I10 Essential (primary) hypertension: Secondary | ICD-10-CM

## 2014-09-05 NOTE — Progress Notes (Signed)
   BP 120/81 mmHg  Pulse 76  Temp(Src) 98.1 F (36.7 C)  Ht 5' 10.5" (1.791 m)  Wt 179 lb (81.194 kg)  BMI 25.31 kg/m2  SpO2 97%   Subjective:    Patient ID: Clarence Mcconnell, male    DOB: 06/03/1970, 44 y.o.   MRN: [id number]  HPI: Clarence Mcconnell is a 44 y.o. male  Chief Complaint  Patient presents with  . Hypertension   Last visit changed from Lisinopril to Amlodipine due to cough.  BP a little higher than here but at goal.  No side-effects.  No chest pain or SOB.    Relevant past medical, surgical, family and social history reviewed and updated as indicated. Interim medical history since our last visit reviewed. Allergies and medications reviewed and updated.  Review of Systems  Per HPI unless specifically indicated above     Objective:    BP 120/81 mmHg  Pulse 76  Temp(Src) 98.1 F (36.7 C)  Ht 5' 10.5" (1.791 m)  Wt 179 lb (81.194 kg)  BMI 25.31 kg/m2  SpO2 97%  Wt Readings from Last 3 Encounters:  09/05/14 179 lb (81.194 kg)  08/08/14 178 lb 9.6 oz (81.012 kg)  07/04/14 178 lb 6.4 oz (80.922 kg)    Physical Exam  Constitutional: He is oriented to person, place, and time. He appears well-developed and well-nourished. No distress.  HENT:  Head: Normocephalic and atraumatic.  Eyes: Conjunctivae and lids are normal. Right eye exhibits no discharge. Left eye exhibits no discharge. No scleral icterus.  Cardiovascular: Normal rate and regular rhythm.   Pulmonary/Chest: Effort normal. No respiratory distress.  Abdominal: Normal appearance and bowel sounds are normal. He exhibits no distension. There is no splenomegaly or hepatomegaly. There is no tenderness.  Musculoskeletal: Normal range of motion.  Neurological: He is alert and oriented to person, place, and time.  Skin: Skin is intact. No rash noted. No pallor.  Psychiatric: He has a normal mood and affect. His behavior is normal. Judgment and thought content normal.    No results found for this or any previous  visit.    Assessment & Plan:   Problem List Items Addressed This Visit      Unprioritized   Hypertension - Primary    At goal with Amlodipine      Relevant Medications   EPIPEN 2-PAK 0.3 MG/0.3ML SOAJ injection       Follow up plan: Return in about 3 months (around 12/06/2014).

## 2014-09-05 NOTE — Assessment & Plan Note (Signed)
At goal with Amlodipine

## 2014-11-04 ENCOUNTER — Telehealth: Payer: Self-pay

## 2014-11-04 ENCOUNTER — Ambulatory Visit (INDEPENDENT_AMBULATORY_CARE_PROVIDER_SITE_OTHER): Payer: 59 | Admitting: Family Medicine

## 2014-11-04 ENCOUNTER — Encounter: Payer: Self-pay | Admitting: Family Medicine

## 2014-11-04 VITALS — BP 119/81 | HR 76 | Temp 97.7°F | Ht 70.8 in | Wt 174.0 lb

## 2014-11-04 DIAGNOSIS — A09 Infectious gastroenteritis and colitis, unspecified: Secondary | ICD-10-CM

## 2014-11-04 MED ORDER — CIPROFLOXACIN HCL 500 MG PO TABS: 500.0000 mg | ORAL_TABLET | Freq: Two times a day (BID) | ORAL | Status: DC

## 2014-11-04 NOTE — Progress Notes (Signed)
   BP 119/81 mmHg  Pulse 76  Temp(Src) 97.7 F (36.5 C)  Ht 5' 10.8" (1.798 m)  Wt 174 lb (78.926 kg)  BMI 24.41 kg/m2  SpO2 99%   Subjective:    Patient ID: Clarence Mcconnell, male    DOB: 1970-12-15, 44 y.o.   MRN: [id number]  HPI: Clarence Mcconnell is a 44 y.o. male  Chief Complaint  Patient presents with  . GI issues    [phone] [id number]   Patient with no known exposure but having nausea with diarrhea for the last 5 days. No fever or chills no throwing up no blood in stool or urine. Having up to 25 loose watery bowel movements a day Sick 5 days ago the next day started Imodium which seemed to help for a little while then stopped and had a lot of diarrhea subsequently. Patient is felt bad with aching all over No other family members sick or other associated sick. Relevant past medical, surgical, family and social history reviewed and updated as indicated. Interim medical history since our last visit reviewed. Allergies and medications reviewed and updated. Other than noted above Review of Systems  Constitutional: Positive for fatigue. Negative for fever and chills.  Respiratory: Negative.   Cardiovascular: Negative.     Per HPI unless specifically indicated above     Objective:    BP 119/81 mmHg  Pulse 76  Temp(Src) 97.7 F (36.5 C)  Ht 5' 10.8" (1.798 m)  Wt 174 lb (78.926 kg)  BMI 24.41 kg/m2  SpO2 99%  Wt Readings from Last 3 Encounters:  11/04/14 174 lb (78.926 kg)  09/05/14 179 lb (81.194 kg)  08/08/14 178 lb 9.6 oz (81.012 kg)    Physical Exam  Constitutional: He is oriented to person, place, and time. He appears well-developed and well-nourished. No distress.  HENT:  Head: Normocephalic and atraumatic.  Right Ear: Hearing and external ear normal.  Left Ear: Hearing and external ear normal.  Nose: Nose normal.  Eyes: Conjunctivae and lids are normal. Right eye exhibits no discharge. Left eye exhibits no discharge. No scleral icterus.  Cardiovascular: Normal rate, regular  rhythm and normal heart sounds.   Pulmonary/Chest: Effort normal and breath sounds normal. No respiratory distress.  Abdominal: Soft. Bowel sounds are normal. He exhibits no distension and no mass. There is no tenderness. There is no rebound and no guarding.  Musculoskeletal: Normal range of motion.  Neurological: He is alert and oriented to person, place, and time.  Skin: Skin is intact. No rash noted.  Psychiatric: He has a normal mood and affect. His speech is normal and behavior is normal. Judgment and thought content normal. Cognition and memory are normal.    No results found for this or any previous visit.    Assessment & Plan:   Problem List Items Addressed This Visit    None    Visit Diagnoses    Infectious diarrhea    -  Primary    Relevant Orders    Basic metabolic panel      discuss infectious diarrhea treatment not to use Imodium We'll use Cipro twice a day until better If not improving will recheck Discuss dietary avoidance of milk and milk products Discuss hydration   Follow up plan: Return if symptoms worsen or fail to improve, for As scheduled.

## 2014-11-04 NOTE — Telephone Encounter (Signed)
Pt added to MAC's schedule for acute visit @ 9:45. CW pt. Thanks.

## 2014-12-12 ENCOUNTER — Ambulatory Visit: Payer: 59 | Admitting: Unknown Physician Specialty

## 2014-12-18 ENCOUNTER — Encounter: Payer: Self-pay | Admitting: Unknown Physician Specialty

## 2014-12-26 ENCOUNTER — Telehealth: Payer: Self-pay

## 2014-12-26 MED ORDER — AMLODIPINE BESYLATE 5 MG PO TABS: 5.0000 mg | ORAL_TABLET | Freq: Every day | ORAL | Status: DC

## 2014-12-26 NOTE — Telephone Encounter (Signed)
Hasn't been seen for BP in quite a while.. It would be better if he was seen next week as he may need his medications changed.

## 2014-12-26 NOTE — Telephone Encounter (Signed)
Called and spoke to patient. We do not have anything open for next week with only 2 providers being here so I rescheduled the patient's appointment with Clarence Mcconnell from 01/18/14 to 01/12/14. Patient states he is going to watch his salt intake but will need a refill on his amlodipine to get to his appointment. [place] [facility] [place]

## 2014-12-26 NOTE — Telephone Encounter (Signed)
Rx sent to his pharmacy

## 2014-12-26 NOTE — Telephone Encounter (Signed)
Patient called stating that his blood pressure has been running between 135-155 over 99-108 for the last week. Patient states he takes amlodipine 5 mg and used to take lisinopril 10 mg. He stopped the lisinopril because he stated it makes him choke more. Patient states he stills has some of that and can take it if he needs to. I informed patient that he will probably need an appointment for his but that Elnita MaxwellCheryl was out of the office. Patient has appointment with Elnita Maxwellheryl 01/18/14 but I told him I would send this message to another provider since he may need to come in before then. Do I need to schedule him an additional appointment with another provider next week? He states he will be out of town until next Thursday.

## 2014-12-30 ENCOUNTER — Ambulatory Visit: Payer: 59 | Admitting: Unknown Physician Specialty

## 2015-01-13 ENCOUNTER — Encounter: Payer: Self-pay | Admitting: Unknown Physician Specialty

## 2015-01-13 ENCOUNTER — Ambulatory Visit (INDEPENDENT_AMBULATORY_CARE_PROVIDER_SITE_OTHER): Payer: 59 | Admitting: Unknown Physician Specialty

## 2015-01-13 VITALS — BP 139/96 | HR 78 | Temp 98.3°F | Ht 69.3 in | Wt 181.4 lb

## 2015-01-13 DIAGNOSIS — I10 Essential (primary) hypertension: Secondary | ICD-10-CM

## 2015-01-13 DIAGNOSIS — E785 Hyperlipidemia, unspecified: Secondary | ICD-10-CM | POA: Diagnosis not present

## 2015-01-13 MED ORDER — AMLODIPINE BESYLATE 5 MG PO TABS: 5.0000 mg | ORAL_TABLET | Freq: Every day | ORAL | Status: DC

## 2015-01-13 MED ORDER — VALSARTAN 80 MG PO TABS
80.0000 mg | ORAL_TABLET | Freq: Every day | ORAL | Status: DC
Start: 2015-01-13 — End: 2015-02-09

## 2015-01-13 NOTE — Progress Notes (Signed)
BP 139/96 mmHg  Pulse 78  Temp(Src) 98.3 F (36.8 C)  Ht 5' 9.3" (1.76 m)  Wt 181 lb 6.4 oz (82.283 kg)  BMI 26.56 kg/m2  SpO2 99%   Subjective:    Patient ID: Clarence Mcconnell, male    DOB: 1970-07-31, 45 y.o.   MRN: [id number]  HPI: Clarence Mcconnell is a 45 y.o. male  Chief Complaint  Patient presents with  . Hypertension    pt states his blood pressure has been running 130-145 over 97-105   Hypertension Strong family history of hypertension.  Pt reports occasional drinker and no sleep apnea symptoms.   Using medications without difficulty Average home BPs as above   No problems or lightheadedness No chest pain with exertion or shortness of breath No Edema   Hyperlipidemia Needs check.  On no medications.    Relevant past medical, surgical, family and social history reviewed and updated as indicated. Interim medical history since our last visit reviewed. Allergies and medications reviewed and updated.  Review of Systems  Constitutional: Negative.   HENT: Negative.        Significant allergic rhinnitis  Eyes: Negative.   Respiratory: Negative.   Cardiovascular: Negative.   Gastrointestinal: Negative.   Endocrine: Negative.   Genitourinary: Negative.   Skin: Negative.   Allergic/Immunologic: Negative.   Neurological: Negative.   Hematological: Negative.   Psychiatric/Behavioral: Negative.     Per HPI unless specifically indicated above     Objective:    BP 139/96 mmHg  Pulse 78  Temp(Src) 98.3 F (36.8 C)  Ht 5' 9.3" (1.76 m)  Wt 181 lb 6.4 oz (82.283 kg)  BMI 26.56 kg/m2  SpO2 99%  Wt Readings from Last 3 Encounters:  01/13/15 181 lb 6.4 oz (82.283 kg)  11/04/14 174 lb (78.926 kg)  09/05/14 179 lb (81.194 kg)    Physical Exam  Constitutional: He is oriented to person, place, and time. He appears well-developed and well-nourished. No distress.  HENT:  Head: Normocephalic and atraumatic.  Eyes: Conjunctivae and lids are normal. Right eye exhibits no  discharge. Left eye exhibits no discharge. No scleral icterus.  Neck: Normal range of motion. Neck supple. No JVD present. Carotid bruit is not present.  Cardiovascular: Normal rate, regular rhythm and normal heart sounds.   Pulmonary/Chest: Effort normal and breath sounds normal. No respiratory distress.  Abdominal: Normal appearance. There is no splenomegaly or hepatomegaly.  Musculoskeletal: Normal range of motion.  Neurological: He is alert and oriented to person, place, and time.  Skin: Skin is warm, dry and intact. No rash noted. No pallor.  Psychiatric: He has a normal mood and affect. His behavior is normal. Judgment and thought content normal.    No results found for this or any previous visit.    Assessment & Plan:   Problem List Items Addressed This Visit      Unprioritized   Hypertension - Primary    Discussed lifestyle changes.  Increase exercise.  Work on weight loss.  Add ARB      Relevant Medications   amLODipine (NORVASC) 5 MG tablet   valsartan (DIOVAN) 80 MG tablet   Other Relevant Orders   Comprehensive metabolic panel   Microalbumin, Urine Waived   Uric acid   Hyperlipidemia   Relevant Medications   amLODipine (NORVASC) 5 MG tablet   valsartan (DIOVAN) 80 MG tablet   Other Relevant Orders   Lipid Panel w/o Chol/HDL Ratio       Follow up plan: Return  in about 4 weeks (around 02/10/2015) for BP.

## 2015-01-13 NOTE — Assessment & Plan Note (Addendum)
Discussed lifestyle changes.  Increase exercise.  Work on weight loss.  Add ARB

## 2015-01-13 NOTE — Patient Instructions (Signed)
DASH Eating Plan  DASH stands for "Dietary Approaches to Stop Hypertension." The DASH eating plan is a healthy eating plan that has been shown to reduce high blood pressure (hypertension). Additional health benefits may include reducing the risk of type 2 diabetes mellitus, heart disease, and stroke. The DASH eating plan may also help with weight loss.  WHAT DO I NEED TO KNOW ABOUT THE DASH EATING PLAN?  For the DASH eating plan, you will follow these general guidelines:  · Choose foods with a percent daily value for sodium of less than 5% (as listed on the food label).  · Use salt-free seasonings or herbs instead of table salt or sea salt.  · Check with your health care provider or pharmacist before using salt substitutes.  · Eat lower-sodium products, often labeled as "lower sodium" or "no salt added."  · Eat fresh foods.  · Eat more vegetables, fruits, and low-fat dairy products.  · Choose whole grains. Look for the word "whole" as the first word in the ingredient list.  · Choose fish and skinless chicken or turkey more often than red meat. Limit fish, poultry, and meat to 6 oz (170 g) each day.  · Limit sweets, desserts, sugars, and sugary drinks.  · Choose heart-healthy fats.  · Limit cheese to 1 oz (28 g) per day.  · Eat more home-cooked food and less restaurant, buffet, and fast food.  · Limit fried foods.  · Cook foods using methods other than frying.  · Limit canned vegetables. If you do use them, rinse them well to decrease the sodium.  · When eating at a restaurant, ask that your food be prepared with less salt, or no salt if possible.  WHAT FOODS CAN I EAT?  Seek help from a dietitian for individual calorie needs.  Grains  Whole grain or whole wheat bread. Brown rice. Whole grain or whole wheat pasta. Quinoa, bulgur, and whole grain cereals. Low-sodium cereals. Corn or whole wheat flour tortillas. Whole grain cornbread. Whole grain crackers. Low-sodium crackers.  Vegetables  Fresh or frozen vegetables  (raw, steamed, roasted, or grilled). Low-sodium or reduced-sodium tomato and vegetable juices. Low-sodium or reduced-sodium tomato sauce and paste. Low-sodium or reduced-sodium canned vegetables.   Fruits  All fresh, canned (in natural juice), or frozen fruits.  Meat and Other Protein Products  Ground beef (85% or leaner), grass-fed beef, or beef trimmed of fat. Skinless chicken or turkey. Ground chicken or turkey. Pork trimmed of fat. All fish and seafood. Eggs. Dried beans, peas, or lentils. Unsalted nuts and seeds. Unsalted canned beans.  Dairy  Low-fat dairy products, such as skim or 1% milk, 2% or reduced-fat cheeses, low-fat ricotta or cottage cheese, or plain low-fat yogurt. Low-sodium or reduced-sodium cheeses.  Fats and Oils  Tub margarines without trans fats. Light or reduced-fat mayonnaise and salad dressings (reduced sodium). Avocado. Safflower, olive, or canola oils. Natural peanut or almond butter.  Other  Unsalted popcorn and pretzels.  The items listed above may not be a complete list of recommended foods or beverages. Contact your dietitian for more options.  WHAT FOODS ARE NOT RECOMMENDED?  Grains  White bread. White pasta. White rice. Refined cornbread. Bagels and croissants. Crackers that contain trans fat.  Vegetables  Creamed or fried vegetables. Vegetables in a cheese sauce. Regular canned vegetables. Regular canned tomato sauce and paste. Regular tomato and vegetable juices.  Fruits  Dried fruits. Canned fruit in light or heavy syrup. Fruit juice.  Meat and Other Protein   Products  Fatty cuts of meat. Ribs, chicken wings, bacon, sausage, bologna, salami, chitterlings, fatback, hot dogs, bratwurst, and packaged luncheon meats. Salted nuts and seeds. Canned beans with salt.  Dairy  Whole or 2% milk, cream, half-and-half, and cream cheese. Whole-fat or sweetened yogurt. Full-fat cheeses or blue cheese. Nondairy creamers and whipped toppings. Processed cheese, cheese spreads, or cheese  curds.  Condiments  Onion and garlic salt, seasoned salt, table salt, and sea salt. Canned and packaged gravies. Worcestershire sauce. Tartar sauce. Barbecue sauce. Teriyaki sauce. Soy sauce, including reduced sodium. Steak sauce. Fish sauce. Oyster sauce. Cocktail sauce. Horseradish. Ketchup and mustard. Meat flavorings and tenderizers. Bouillon cubes. Hot sauce. Tabasco sauce. Marinades. Taco seasonings. Relishes.  Fats and Oils  Butter, stick margarine, lard, shortening, ghee, and bacon fat. Coconut, palm kernel, or palm oils. Regular salad dressings.  Other  Pickles and olives. Salted popcorn and pretzels.  The items listed above may not be a complete list of foods and beverages to avoid. Contact your dietitian for more information.  WHERE CAN I FIND MORE INFORMATION?  National Heart, [employer], [employer] Institute: www.nhlbi.nih.gov/health/health-topics/topics/dash/     This information is not intended to replace advice given to you by your health care provider. Make sure you discuss any questions you have with your health care provider.     Document Released: 12/16/2010 Document Revised: 01/17/2014 Document Reviewed: 10/31/2012  Elsevier Interactive Patient Education ©2016 [employer]

## 2015-01-14 ENCOUNTER — Other Ambulatory Visit: Payer: Self-pay | Admitting: Unknown Physician Specialty

## 2015-01-14 LAB — COMPREHENSIVE METABOLIC PANEL
A/G RATIO: 2.5 (ref 1.1–2.5)
ALK PHOS: 96 IU/L (ref 39–117)
ALT: 33 IU/L (ref 0–44)
AST: 34 IU/L (ref 0–40)
Albumin: 4.7 g/dL (ref 3.5–5.5)
BILIRUBIN TOTAL: 0.4 mg/dL (ref 0.0–1.2)
BUN/Creatinine Ratio: 15 (ref 9–20)
BUN: 13 mg/dL (ref 6–24)
CO2: 24 mmol/L (ref 18–29)
Calcium: 9.6 mg/dL (ref 8.7–10.2)
Chloride: 100 mmol/L (ref 96–106)
Creatinine, Ser: 0.84 mg/dL (ref 0.76–1.27)
GFR calc Af Amer: 124 mL/min/{1.73_m2} (ref >59)
GFR calc non Af Amer: 107 mL/min/{1.73_m2} (ref >59)
GLUCOSE: 77 mg/dL (ref 65–99)
Globulin, Total: 1.9 g/dL (ref 1.5–4.5)
POTASSIUM: 4.5 mmol/L (ref 3.5–5.2)
Sodium: 141 mmol/L (ref 134–144)
Total Protein: 6.6 g/dL (ref 6.0–8.5)

## 2015-01-14 LAB — LIPID PANEL W/O CHOL/HDL RATIO
CHOLESTEROL TOTAL: 246 mg/dL — AB (ref 100–199)
HDL: 46 mg/dL (ref >39)
LDL Calculated: 147 mg/dL — ABNORMAL HIGH (ref 0–99)
Triglycerides: 263 mg/dL — ABNORMAL HIGH (ref 0–149)
VLDL CHOLESTEROL CAL: 53 mg/dL — AB (ref 5–40)

## 2015-01-14 LAB — MICROALBUMIN, URINE WAIVED
CREATININE, URINE WAIVED: 50 mg/dL (ref 10–300)
Microalb, Ur Waived: 10 mg/L (ref 0–19)

## 2015-01-14 LAB — URIC ACID: Uric Acid: 5.3 mg/dL (ref 3.7–8.6)

## 2015-01-14 MED ORDER — ATORVASTATIN CALCIUM 20 MG PO TABS: 20.0000 mg | ORAL_TABLET | Freq: Every day | ORAL | Status: DC

## 2015-01-14 NOTE — Progress Notes (Signed)
Start on Atorvastatin for high cholesterol and strong family history

## 2015-01-19 ENCOUNTER — Ambulatory Visit: Payer: 59 | Admitting: Unknown Physician Specialty

## 2015-02-09 ENCOUNTER — Encounter: Payer: Self-pay | Admitting: Unknown Physician Specialty

## 2015-02-09 ENCOUNTER — Ambulatory Visit (INDEPENDENT_AMBULATORY_CARE_PROVIDER_SITE_OTHER): Payer: 59 | Admitting: Unknown Physician Specialty

## 2015-02-09 VITALS — BP 132/88 | HR 76 | Temp 98.3°F | Ht 70.0 in | Wt 177.6 lb

## 2015-02-09 DIAGNOSIS — I10 Essential (primary) hypertension: Secondary | ICD-10-CM

## 2015-02-09 DIAGNOSIS — E785 Hyperlipidemia, unspecified: Secondary | ICD-10-CM | POA: Diagnosis not present

## 2015-02-09 MED ORDER — AMLODIPINE BESYLATE 5 MG PO TABS: 5.0000 mg | ORAL_TABLET | Freq: Every day | ORAL | Status: DC

## 2015-02-09 MED ORDER — VALSARTAN 80 MG PO TABS: 80.0000 mg | ORAL_TABLET | Freq: Every day | ORAL | Status: DC

## 2015-02-09 NOTE — Progress Notes (Signed)
BP 132/88 mmHg  Pulse 76  Temp(Src) 98.3 F (36.8 C)  Ht  (1.778 m)  Wt 177 lb 9.6 oz (80.559 kg)  BMI 25.48 kg/m2  SpO2 97%   Subjective:    Patient ID: Clarence Mcconnell, male    DOB: 01-Aug-1970, 45 y.o.   MRN: [id number]  HPI: Clarence Mcconnell is a 45 y.o. male  Chief Complaint  Patient presents with  . Hypertension  Hypertension Using medications without difficulty: yes Average home BPs: 2-3 times per week, bought new cuff due to getting high readings;130/87 this morning.    No problems or lightheadedness: denies No chest pain with exertion or shortness of breath: denies No Edema: some, with longer periods of sitting especially   Hyperlipidemia Using medications without problems: not taking atorvastatin, patient states forgot to pick up but plans on picking up soon. No Muscle aches: denies Diet compliance: patients states trying to pick healthier options, eats out 6 days a week  Exercise: 2-3 times a week with more cardio than weights, usually for 1 hr  Relevant past medical, surgical, family and social history reviewed and updated as indicated. Interim medical history since our last visit reviewed. Allergies and medications reviewed and updated.  Review of Systems  Constitutional: Negative.   HENT: Negative.   Eyes: Negative.   Respiratory: Negative.   Cardiovascular: Negative.   Gastrointestinal: Negative.   Endocrine: Negative.   Genitourinary: Negative.   Skin: Negative.   Allergic/Immunologic: Negative.   Neurological: Negative.   Hematological: Negative.   Psychiatric/Behavioral: Negative.      Per HPI unless specifically indicated above     Objective:    BP 132/88 mmHg  Pulse 76  Temp(Src) 98.3 F (36.8 C)  Ht  (1.778 m)  Wt 177 lb 9.6 oz (80.559 kg)  BMI 25.48 kg/m2  SpO2 97%  Wt Readings from Last 3 Encounters:  02/09/15 177 lb 9.6 oz (80.559 kg)  01/13/15 181 lb 6.4 oz (82.283 kg)  11/04/14 174 lb (78.926 kg)    Physical Exam   Constitutional: He is oriented to person, place, and time. He appears well-developed and well-nourished. No distress.  HENT:  Head: Normocephalic and atraumatic.  Eyes: Conjunctivae and lids are normal. Right eye exhibits no discharge. Left eye exhibits no discharge. No scleral icterus.  Neck: Normal range of motion. Neck supple. No JVD present. Carotid bruit is not present.  Cardiovascular: Normal rate, regular rhythm and normal heart sounds.   Pulmonary/Chest: Effort normal and breath sounds normal. No respiratory distress.  Abdominal: Normal appearance. There is no splenomegaly or hepatomegaly.  Musculoskeletal: Normal range of motion.  Neurological: He is alert and oriented to person, place, and time.  Skin: Skin is warm, dry and intact. No rash noted. No pallor.  Psychiatric: He has a normal mood and affect. His behavior is normal. Judgment and thought content normal.    Results for orders placed or performed in visit on 01/13/15  Comprehensive metabolic panel  Result Value Ref Range   Glucose 77 65 - 99 mg/dL   BUN 13 6 - 24 mg/dL   Creatinine, Ser 4.09 0.76 - 1.27 mg/dL   GFR calc non Af Amer 107 >59 mL/min/1.73   GFR calc Af Amer 124 >59 mL/min/1.73   BUN/Creatinine Ratio 15 9 - 20   Sodium 141 134 - 144 mmol/L   Potassium 4.5 3.5 - 5.2 mmol/L   Chloride 100 96 - 106 mmol/L   CO2 24 18 - 29 mmol/L  Calcium 9.6 8.7 - 10.2 mg/dL   Total Protein 6.6 6.0 - 8.5 g/dL   Albumin 4.7 3.5 - 5.5 g/dL   Globulin, Total 1.9 1.5 - 4.5 g/dL   Albumin/Globulin Ratio 2.5 1.1 - 2.5   Bilirubin Total 0.4 0.0 - 1.2 mg/dL   Alkaline Phosphatase 96 39 - 117 IU/L   AST 34 0 - 40 IU/L   ALT 33 0 - 44 IU/L  Lipid Panel w/o Chol/HDL Ratio  Result Value Ref Range   Cholesterol, Total 246 (H) 100 - 199 mg/dL   Triglycerides 161 (H) 0 - 149 mg/dL   HDL 46 >09 mg/dL   VLDL Cholesterol Cal 53 (H) 5 - 40 mg/dL   LDL Calculated 604 (H) 0 - 99 mg/dL  Microalbumin, Urine Waived  Result Value Ref  Range   Microalb, Ur Waived 10 0 - 19 mg/L   Creatinine, Urine Waived 50 10 - 300 mg/dL   Microalb/Creat Ratio <30 <30 mg/g  Uric acid  Result Value Ref Range   Uric Acid 5.3 3.7 - 8.6 mg/dL      Assessment & Plan:   Problem List Items Addressed This Visit      Unprioritized   Hypertension - Primary    Stable, continue present medications.       Relevant Medications   amLODipine (NORVASC) 5 MG tablet   valsartan (DIOVAN) 80 MG tablet   Hyperlipidemia    Patient plans on picking up atorvastatin that was prescribed 01/13/15 at pharmacy soon and begin taking.       Relevant Medications   amLODipine (NORVASC) 5 MG tablet   valsartan (DIOVAN) 80 MG tablet       Follow up plan: Return in about 6 months (around 08/09/2015) for physical.

## 2015-02-09 NOTE — Assessment & Plan Note (Signed)
Patient plans on picking up atorvastatin that was prescribed 01/13/15 at pharmacy soon and begin taking.

## 2015-02-09 NOTE — Assessment & Plan Note (Signed)
Stable, continue present medications.   

## 2015-07-19 ENCOUNTER — Other Ambulatory Visit: Payer: Self-pay | Admitting: Unknown Physician Specialty

## 2015-08-10 ENCOUNTER — Encounter: Payer: 59 | Admitting: Unknown Physician Specialty

## 2015-11-02 ENCOUNTER — Encounter: Payer: 59 | Admitting: Unknown Physician Specialty

## 2015-12-12 ENCOUNTER — Other Ambulatory Visit: Payer: Self-pay | Admitting: Unknown Physician Specialty

## 2016-03-10 IMAGING — CR DG WRIST 2V*R*
1 series · 2 of 2 positions shown · non-contrast
Comparison: 05/18/2014.

CLINICAL DATA: ORIF.  Postoperative radiographs.

EXAM:
RIGHT WRIST - 2 VIEW

[Series 1: pa · 0.17mm/px · 2 of 2 slices shown]
[im 1/2]
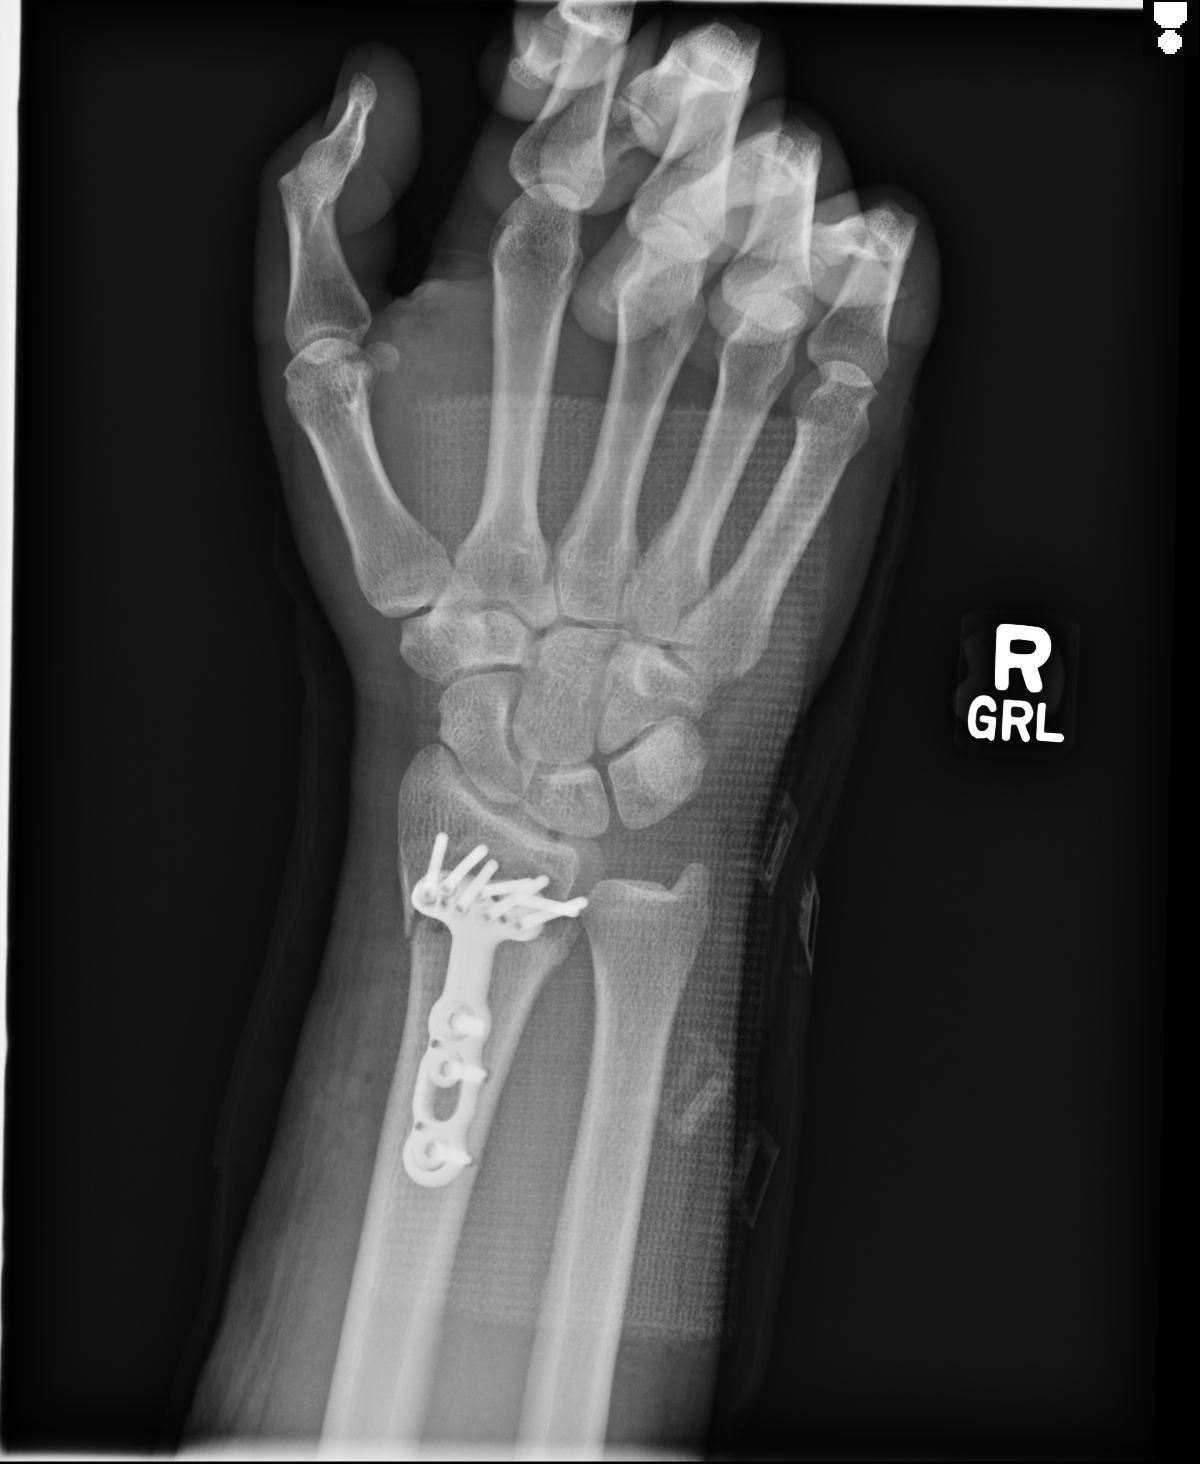
[im 2/2]
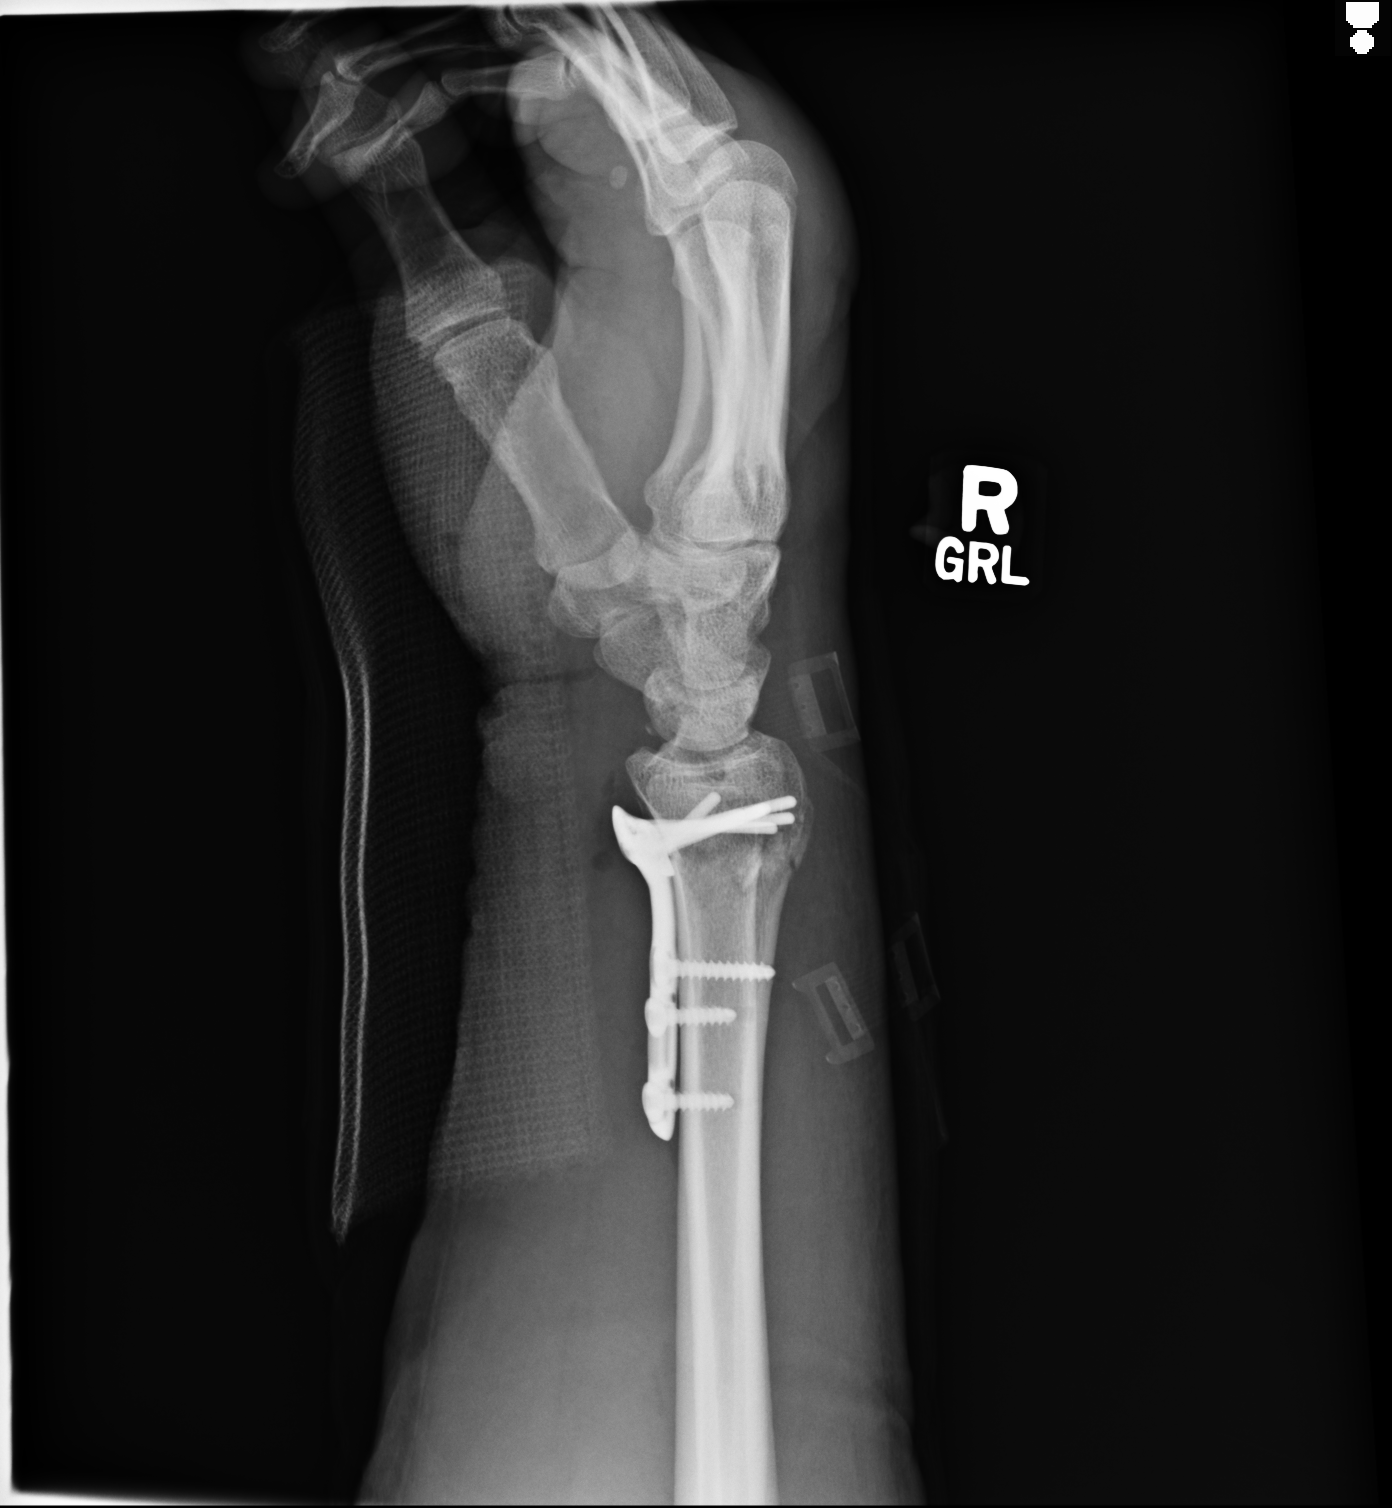

[2 of 2 positions shown; findings below may reference images not displayed]

FINDINGS: Volar plate and screw fixation of the distal radius is present. No
complicating features are identified. Alignment is improved. Tiny
bone fragment is present in the volar aspect of the wrist joint,
just volar to the lunate bone.
IMPRESSION: ORIF of the RIGHT distal radius.

## 2016-07-11 ENCOUNTER — Other Ambulatory Visit: Payer: Self-pay | Admitting: Family Medicine

## 2016-08-26 ENCOUNTER — Telehealth: Payer: Self-pay

## 2016-08-26 MED ORDER — LOSARTAN POTASSIUM 50 MG PO TABS: 50.0000 mg | ORAL_TABLET | Freq: Every day | ORAL | 0 refills | Status: AC

## 2016-08-26 NOTE — Telephone Encounter (Signed)
Letter generated and sent to patient about medication change and needing to be seen.

## 2016-08-26 NOTE — Telephone Encounter (Signed)
[employer] sent a fax regarding patient's valsartan prescription being recalled. Alternatives are azilsartan, candesartan, eprosartan, irbesartan, losartan, olmesartan, and telmisartan. Can we send in one of these instead?

## 2016-08-26 NOTE — Telephone Encounter (Signed)
He is overdue to be seen.  I will only send in #[phone]

## 2016-09-26 ENCOUNTER — Telehealth: Payer: Self-pay | Admitting: Unknown Physician Specialty

## 2016-09-26 NOTE — Telephone Encounter (Signed)
Patient has not been seen since 01-2015 he was need to come in to speak with [facility].

## 2016-09-27 NOTE — Telephone Encounter (Signed)
Called patient to schedule an appointment with provider but no answer, therefore I left a vm to call us back and schedule a f/u appt with Elnita Maxwell to discuss about getting a referral since patient has not been seen since 2017, thanks.

## 2017-02-06 ENCOUNTER — Other Ambulatory Visit: Payer: Self-pay | Admitting: Family Medicine

## 2017-02-06 NOTE — Telephone Encounter (Signed)
Your patient
